# Patient Record
Sex: Male | Born: 1990 | Race: Black or African American | Hispanic: No | Marital: Single | Smoking: Never smoker
Health system: Southern US, Community
[De-identification: ages and names within clinical notes are randomized; demographics above are authoritative.]

## PROBLEM LIST (undated history)

## (undated) HISTORY — PX: KNEE SURGERY: SHX244

## (undated) HISTORY — PX: MOUTH SURGERY: SHX715

---
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
## 2011-03-13 NOTE — Op Note (Signed)
NAME:  Juan Beasley, Juan Beasley NO.:  [id number]   MEDICAL RECORD NO.:  [id number]          PATIENT TYPE:  AMB   LOCATION:  DAY                           FACILITY:  [facility]   PHYSICIAN:  Vickki Hearing, M.D.DATE OF BIRTH:  Aug 17, 1991   DATE OF PROCEDURE:  09/14/2008  DATE OF DISCHARGE:                               OPERATIVE REPORT   PREOPERATIVE DIAGNOSIS:  Infection, left knee.   POSTOPERATIVE DIAGNOSIS:  Infected prepatellar bursa.   PROCEDURE:  Incision and drainage and removal of left prepatellar bursa.   SURGEON:  Vickki Hearing, MD.   ANESTHETIC:  General.   OPERATIVE FINDINGS:  There was a bursal sac, which was infected,  inflamed, and draining purulent material.  This did not violate the  fascial planes.   HISTORY:  A 20 year old male presented with a 4-day history of drainage  from his left knee and a left knee injury.  He had cut the front of his  knee.  He did not tell anybody about it.  It festered for a couple of  days then started to drain.  We saw him in the office and felt that it  was deeper than the superficial infection and recommended surgery.   The patient was identified in the preop holding area.  His left knee was  marked for surgery and countersigned.  No antibiotics were done to  crease the yield of cultures.  The patient was taken to surgery, had  general anesthetic, and left leg was prepped with Betadine and draped  sterilely.  Time-out was completed.   Straight midline incision was made down to the fascia.  There was a  notable sinus tract in a bursal sac, which was infected.  It was  removed.  Fascial planes were inspected and found to be intact.  The  wound was irrigated with pulse lavage and then closed over Iodoform  gauze packing through a separate incision.  Staples were applied to the  skin after 0 Monocryl closure.  The wound was covered sterilely.  The  patient was extubated and taken to recovery room in stable  condition.   Started on clindamycin.  Dressing change will be done on Thursday in the  office.      Vickki Hearing, M.D.  Electronically Signed    SEH/MEDQ  D:  09/15/2008  T:  09/15/2008  Job:  [id number]

## 2011-05-20 ENCOUNTER — Emergency Department (HOSPITAL_COMMUNITY)
Admission: EM | Admit: 2011-05-20 | Discharge: 2011-05-20 | Disposition: A | Discharge status: Home / Self Care | Payer: Self-pay | Attending: Emergency Medicine | Admitting: Emergency Medicine

## 2011-05-20 ENCOUNTER — Encounter: Payer: Self-pay | Admitting: *Deleted

## 2011-05-20 DIAGNOSIS — T63461A Toxic effect of venom of wasps, accidental (unintentional), initial encounter: Secondary | ICD-10-CM | POA: Insufficient documentation

## 2011-05-20 DIAGNOSIS — R061 Stridor: Secondary | ICD-10-CM | POA: Insufficient documentation

## 2011-05-20 DIAGNOSIS — T63441A Toxic effect of venom of bees, accidental (unintentional), initial encounter: Secondary | ICD-10-CM

## 2011-05-20 DIAGNOSIS — T6391XA Toxic effect of contact with unspecified venomous animal, accidental (unintentional), initial encounter: Secondary | ICD-10-CM | POA: Insufficient documentation

## 2011-05-20 DIAGNOSIS — Y92009 Unspecified place in unspecified non-institutional (private) residence as the place of occurrence of the external cause: Secondary | ICD-10-CM | POA: Insufficient documentation

## 2011-05-20 MED ORDER — PREDNISONE 10 MG PO TABS: ORAL_TABLET | ORAL | Status: DC

## 2011-05-20 MED ORDER — PREDNISONE 20 MG PO TABS
60.0000 mg | ORAL_TABLET | Freq: Once | ORAL | Status: AC
  Administered 2011-05-20: 60 mg via ORAL
  Filled 2011-05-20: qty 3

## 2011-05-20 MED ORDER — DIPHENHYDRAMINE HCL 25 MG PO CAPS
50.0000 mg | ORAL_CAPSULE | Freq: Once | ORAL | Status: AC
  Administered 2011-05-20: 50 mg via ORAL
  Filled 2011-05-20: qty 2

## 2011-05-20 MED ORDER — FAMOTIDINE 20 MG PO TABS
20.0000 mg | ORAL_TABLET | Freq: Once | ORAL | Status: AC
  Administered 2011-05-20: 20 mg via ORAL
  Filled 2011-05-20: qty 1

## 2011-05-20 NOTE — ED Notes (Signed)
Pt a/ox4. Resp even and unlabored. D/C instructions reviewed with pt. Pt verbalized understanding. Pt escorted to d/c desk. Pt ambulated with steady gate.

## 2011-05-20 NOTE — ED Notes (Signed)
Pt states was mowing grass yesterday and got stung by multiple bees around head and bil arms.  Swelling noted to face, head.  C/o itching and pain to head.  Denies difficulty breathing/trouble swallowing.

## 2011-05-20 NOTE — ED Provider Notes (Signed)
History     Chief Complaint  Patient presents with  . Allergic Reaction   Patient is a 20 y.o. male presenting with allergic reaction. The history is provided by the patient and a parent. No language interpreter was used.  Allergic Reaction The primary symptoms do not include wheezing, shortness of breath, cough, nausea, vomiting, palpitations, angioedema or urticaria. The current episode started yesterday. The problem has been gradually improving. This is a new problem.  The onset of the reaction was associated with insect bite/sting.    History reviewed. No pertinent past medical history.  Past Surgical History  Procedure Date  . Knee surgery   . Mouth surgery     No family history on file.  History  Substance Use Topics  . Smoking status: Never Smoker   . Smokeless tobacco: Not on file  . Alcohol Use: No      Review of Systems  Respiratory: Positive for stridor. Negative for cough, chest tightness, shortness of breath and wheezing.   Cardiovascular: Negative for palpitations.  Gastrointestinal: Negative for nausea and vomiting.  Skin: Negative for color change and pallor.    Physical Exam  BP 136/84  Pulse 72  Temp(Src) 98.6 F (37 C) (Oral)  Resp 18  Ht 5\' 10"  (1.778 m)  Wt 197 lb 11.2 oz (89.676 kg)  BMI 28.37 kg/m2  SpO2 98%  Physical Exam  Nursing note and vitals reviewed. Constitutional: He is oriented to person, place, and time. Vital signs are normal. He appears well-developed and well-nourished.  HENT:  Head: Normocephalic and atraumatic.  Right Ear: External ear normal.  Left Ear: External ear normal.  Nose: Nose normal.  Mouth/Throat: No oropharyngeal exudate.       Several small areas of swelling to scalp.  Eyes: Conjunctivae and EOM are normal. Pupils are equal, round, and reactive to light. Right eye exhibits no discharge. Left eye exhibits no discharge. No scleral icterus.  Neck: Normal range of motion. Neck supple. No JVD present. No  tracheal deviation present. No thyromegaly present.  Cardiovascular: Normal rate, regular rhythm, normal heart sounds, intact distal pulses and normal pulses.  Exam reveals no gallop and no friction rub.   No murmur heard. Pulmonary/Chest: Effort normal and breath sounds normal. No stridor. No respiratory distress. He has no wheezes. He has no rales. He exhibits no tenderness.  Abdominal: Soft. Normal appearance and bowel sounds are normal. He exhibits no distension and no mass. There is no tenderness. There is no rebound and no guarding.  Musculoskeletal: Normal range of motion. He exhibits no edema and no tenderness.  Lymphadenopathy:    He has no cervical adenopathy.  Neurological: He is alert and oriented to person, place, and time. He has normal reflexes. No cranial nerve deficit. Coordination normal. GCS eye subscore is 4. GCS verbal subscore is 5. GCS motor subscore is 6.  Reflex Scores:      Tricep reflexes are 2+ on the right side and 2+ on the left side.      Bicep reflexes are 2+ on the right side and 2+ on the left side.      Brachioradialis reflexes are 2+ on the right side and 2+ on the left side.      Patellar reflexes are 2+ on the right side and 2+ on the left side.      Achilles reflexes are 2+ on the right side and 2+ on the left side. Skin: Skin is warm and dry. No rash noted. He is  not diaphoretic.       Scattered areas of redness to B forearms.  Psychiatric: He has a normal mood and affect. His speech is normal and behavior is normal. Judgment and thought content normal. Cognition and memory are normal.    ED Course  Procedures  MDM pt in no distress      Worthy Rancher, [place] 05/20/11 1818

## 2011-05-21 NOTE — ED Provider Notes (Signed)
Evaluation and management procedures were performed by the mid-level provider (PA/NP/CNM) under my supervision/collaboration.   Vida Roller, MD 05/21/11 [phone]

## 2011-07-31 LAB — HEMOGLOBIN AND HEMATOCRIT, BLOOD
HCT: 37.3
Hemoglobin: 12.5

## 2011-07-31 LAB — WOUND CULTURE

## 2011-07-31 LAB — ANAEROBIC CULTURE

## 2011-09-10 ENCOUNTER — Emergency Department (HOSPITAL_COMMUNITY)
Admission: EM | Admit: 2011-09-10 | Discharge: 2011-09-10 | Disposition: A | Discharge status: Home / Self Care | Payer: Self-pay | Attending: Emergency Medicine | Admitting: Emergency Medicine

## 2011-09-10 ENCOUNTER — Encounter (HOSPITAL_COMMUNITY): Payer: Self-pay | Admitting: Emergency Medicine

## 2011-09-10 DIAGNOSIS — IMO0002 Reserved for concepts with insufficient information to code with codable children: Secondary | ICD-10-CM | POA: Insufficient documentation

## 2011-09-10 DIAGNOSIS — IMO0001 Reserved for inherently not codable concepts without codable children: Secondary | ICD-10-CM | POA: Insufficient documentation

## 2011-09-10 MED ORDER — BUPIVACAINE HCL (PF) 0.5 % IJ SOLN
30.0000 mL | Freq: Once | INTRAMUSCULAR | Status: AC
  Administered 2011-09-10: 30 mL
  Filled 2011-09-10: qty 30

## 2011-09-10 MED ORDER — BACITRACIN-NEOMYCIN-POLYMYXIN 400-5-5000 EX OINT
TOPICAL_OINTMENT | Freq: Once | CUTANEOUS | Status: AC
  Administered 2011-09-10: 1 via TOPICAL
  Filled 2011-09-10: qty 1

## 2011-09-10 MED ORDER — CEPHALEXIN 500 MG PO CAPS: 500.0000 mg | ORAL_CAPSULE | Freq: Three times a day (TID) | ORAL | Status: AC

## 2011-09-10 NOTE — ED Notes (Signed)
Pt states he bit his left thumb nail to the quick and now around the nail is swollen and painful.

## 2011-09-10 NOTE — ED Notes (Signed)
Pt a/ox4. Resp even and unlabored. NAD at this time. D/C instructions and Rx x1 reviewed with pt. Pt verbalized understanding. Pt ambulated to lobby with steady gate.

## 2011-09-10 NOTE — ED Provider Notes (Signed)
History     CSN: [id number] Arrival date & time: 09/10/2011 11:04 AM   First MD Initiated Contact with Patient 09/10/11 1155      Chief Complaint  Patient presents with  . Hand Pain    (Consider location/radiation/quality/duration/timing/severity/associated sxs/prior treatment) HPI Comments: Patient c/o pain, redness and swelling to his left medial thumb.  Has hx of same.  States that he bites his fingernails frequently.  He denies fever, red streaks or numbness of his hand or finger.  Patient is a 20 y.o. male presenting with hand pain. The history is provided by the patient.  Hand Pain This is a recurrent problem. The current episode started in the past 7 days. The problem occurs constantly. The problem has been gradually worsening. Associated symptoms include myalgias. Pertinent negatives include no arthralgias, chills, fatigue, fever, numbness, rash, swollen glands, vomiting or weakness. The symptoms are aggravated by exertion (palpation and movement). He has tried nothing for the symptoms.    History reviewed. No pertinent past medical history.  Past Surgical History  Procedure Date  . Knee surgery   . Mouth surgery     History reviewed. No pertinent family history.  History  Substance Use Topics  . Smoking status: Never Smoker   . Smokeless tobacco: Not on file  . Alcohol Use: No      Review of Systems  Constitutional: Negative for fever, chills and fatigue.  HENT: Negative for trouble swallowing.   Gastrointestinal: Negative for vomiting.  Genitourinary: Negative for dysuria, hematuria and flank pain.  Musculoskeletal: Positive for myalgias. Negative for back pain and arthralgias.  Skin: Positive for color change and wound. Negative for rash.  Neurological: Negative for dizziness, weakness and numbness.  Hematological: Negative for adenopathy. Does not bruise/bleed easily.  All other systems reviewed and are negative.    Allergies  Review of patient's  allergies indicates no known allergies.  Home Medications  No current outpatient prescriptions on file.  BP 116/73  Pulse 72  Temp(Src) 98.5 F (36.9 C) (Oral)  Resp 18  Ht 5\' 10"  (1.778 m)  Wt 203 lb (92.08 kg)  BMI 29.13 kg/m2  SpO2 99%  Physical Exam  Nursing note and vitals reviewed. Constitutional: He is oriented to person, place, and time. He appears well-developed and well-nourished. No distress.  HENT:  Head: Normocephalic and atraumatic.  Mouth/Throat: Oropharynx is clear and moist.  Cardiovascular: Normal rate, regular rhythm and normal heart sounds.   Pulmonary/Chest: Breath sounds normal.  Musculoskeletal: Normal range of motion. He exhibits edema and tenderness.       Hands: Neurological: He is alert and oriented to person, place, and time. No cranial nerve deficit. He exhibits normal muscle tone. Coordination normal.  Skin: Skin is warm and dry.       See MS exam    ED Course  Drain paronychia Date/Time: 09/10/2011 1:51 PM Performed by: Trisha Mangle, Siomara Burkel L. Authorized by: Maxwell Caul Consent: Verbal consent obtained. Written consent not obtained. Risks and benefits: risks, benefits and alternatives were discussed Consent given by: patient Patient understanding: patient states understanding of the procedure being performed Patient consent: the patient's understanding of the procedure matches consent given Patient identity confirmed: verbally with patient Time out: Immediately prior to procedure a "time out" was called to verify the correct patient, procedure, equipment, support staff and site/side marked as required. Preparation: Patient was prepped and draped in the usual sterile fashion. Local anesthesia used: digital block performed using .5% marcaine to medial thumb  Patient  sedated: no Patient tolerance: Patient tolerated the procedure well with no immediate complications.   (including critical care time)       MDM  09/10/2011 Paronchyia  to the left medial thumb.  Successfully drained by me.  Pt feeling better.  No felon           Aiesha Leland L. [place], [place] 09/10/11 2126

## 2011-09-10 NOTE — ED Notes (Signed)
Telfa applied with tube gauze dressing to left thumb.

## 2011-09-11 NOTE — ED Provider Notes (Signed)
Medical screening examination/treatment/procedure(s) were performed by non-physician practitioner and as supervising physician I was immediately available for consultation/collaboration.  Nicoletta Dress. Colon Branch, MD 09/11/11 [phone]

## 2013-12-15 ENCOUNTER — Emergency Department (HOSPITAL_COMMUNITY): Payer: Self-pay

## 2013-12-15 ENCOUNTER — Encounter (HOSPITAL_COMMUNITY): Payer: Self-pay | Admitting: Emergency Medicine

## 2013-12-15 ENCOUNTER — Emergency Department (HOSPITAL_COMMUNITY)
Admission: EM | Admit: 2013-12-15 | Discharge: 2013-12-15 | Disposition: A | Discharge status: Home / Self Care | Payer: Self-pay | Attending: Emergency Medicine | Admitting: Emergency Medicine

## 2013-12-15 DIAGNOSIS — J039 Acute tonsillitis, unspecified: Secondary | ICD-10-CM | POA: Insufficient documentation

## 2013-12-15 MED ORDER — CLINDAMYCIN PHOSPHATE 900 MG/50ML IV SOLN
900.0000 mg | Freq: Once | INTRAVENOUS | Status: AC
  Administered 2013-12-15: 900 mg via INTRAVENOUS
  Filled 2013-12-15: qty 50

## 2013-12-15 MED ORDER — IBUPROFEN 600 MG PO TABS: 600.0000 mg | ORAL_TABLET | Freq: Four times a day (QID) | ORAL | Status: DC | PRN

## 2013-12-15 MED ORDER — CLINDAMYCIN HCL 300 MG PO CAPS: 300.0000 mg | ORAL_CAPSULE | Freq: Four times a day (QID) | ORAL | Status: DC

## 2013-12-15 MED ORDER — IOHEXOL 300 MG/ML  SOLN
75.0000 mL | Freq: Once | INTRAMUSCULAR | Status: AC | PRN
  Administered 2013-12-15: 75 mL via INTRAVENOUS

## 2013-12-15 MED ORDER — DEXAMETHASONE SODIUM PHOSPHATE 10 MG/ML IJ SOLN
10.0000 mg | Freq: Once | INTRAMUSCULAR | Status: AC
  Administered 2013-12-15: 10 mg via INTRAVENOUS
  Filled 2013-12-15: qty 1

## 2013-12-15 MED ORDER — IBUPROFEN 100 MG/5ML PO SUSP
400.0000 mg | Freq: Once | ORAL | Status: AC
  Administered 2013-12-15: 400 mg via ORAL
  Filled 2013-12-15: qty 20

## 2013-12-15 NOTE — ED Provider Notes (Signed)
CSN: [id number]     Arrival date & time 12/15/13  1244 History   First MD Initiated Contact with Patient 12/15/13 1257     Chief Complaint  Patient presents with  . Sore Throat     (Consider location/radiation/quality/duration/timing/severity/associated sxs/prior Treatment) HPI Comments: Juan Beasley is a 23 y.o. Male presenting with a two-day history of sore throat, globus sensation and subjective fevers and nonproductive cough.  Has also had nasal congestion with clear nasal discharge.  He has noticed changes in his voice since his symptoms began.  He is able to drink but has had reduced solid food intake secondary to pain.  He has had no medications prior to arrival for this problem.  He denies shortness of breath, wheezing, stridor, chest pain, nausea or vomiting.  He has noticed several swollen knots in his neck.     The history is provided by the patient.    History reviewed. No pertinent past medical history. Past Surgical History  Procedure Laterality Date  . Knee surgery    . Mouth surgery     History reviewed. No pertinent family history. History  Substance Use Topics  . Smoking status: Never Smoker   . Smokeless tobacco: Not on file  . Alcohol Use: No    Review of Systems  Constitutional: Positive for fever and chills.  HENT: Positive for congestion, rhinorrhea and sore throat. Negative for ear pain, sinus pressure, trouble swallowing and voice change.   Eyes: Negative for discharge.  Respiratory: Positive for cough. Negative for shortness of breath, wheezing and stridor.   Cardiovascular: Negative for chest pain.  Gastrointestinal: Negative for abdominal pain.  Genitourinary: Negative.   Skin: Negative for color change and rash.      Allergies  Review of patient's allergies indicates no known allergies.  Home Medications   Current Outpatient Rx  Name  Route  Sig  Dispense  Refill  . clindamycin (CLEOCIN) 300 MG capsule   Oral   Take 1 capsule (300  mg total) by mouth 4 (four) times daily.   28 capsule   0   . ibuprofen (ADVIL,MOTRIN) 600 MG tablet   Oral   Take 1 tablet (600 mg total) by mouth every 6 (six) hours as needed for moderate pain.   30 tablet   0    BP 141/71  Pulse 106  Temp(Src) 99.4 F (37.4 C) (Oral)  Ht 5\' 9"  (1.753 m)  Wt 220 lb (99.791 kg)  BMI 32.47 kg/m2  SpO2 97% Physical Exam  Constitutional: He is oriented to person, place, and time. He appears well-developed and well-nourished.  HENT:  Head: Normocephalic and atraumatic.  Right Ear: Tympanic membrane, external ear and ear canal normal.  Left Ear: Tympanic membrane, external ear and ear canal normal.  Nose: Mucosal edema and rhinorrhea present.  Mouth/Throat: Uvula is midline and mucous membranes are normal. No oropharyngeal exudate, posterior oropharyngeal edema, posterior oropharyngeal erythema or tonsillar abscesses.  Uvula is midline.  Posterior pharynx and tonsillar pillars are.  Benefits, there is bilateral tonsillar edema with right greater than left, no obvious peritonsillar abscess.  No exudate.  Tonsillar lymph nodes are enlarged and tender.  Eyes: Conjunctivae are normal.  Cardiovascular: Normal rate and normal heart sounds.   Pulmonary/Chest: Effort normal. No respiratory distress. He has no wheezes. He has no rales.  Abdominal: Soft. There is no tenderness.  Musculoskeletal: Normal range of motion.  Neurological: He is alert and oriented to person, place, and time.  Skin: Skin  is warm and dry. No rash noted.  Psychiatric: He has a normal mood and affect.    ED Course  Procedures (including critical care time) Labs Review Labs Reviewed - No data to display Imaging Review Ct Soft Tissue Neck W Contrast  12/15/2013   CLINICAL DATA:  SORE THROAT SORE THROAT  EXAM: CT NECK WITH CONTRAST  TECHNIQUE: Multidetector CT imaging of the neck was performed using the standard protocol following the bolus administration of intravenous contrast.   CONTRAST:  75mL OMNIPAQUE IOHEXOL 300 MG/ML  SOLN  COMPARISON:  None.  FINDINGS: The skull base is unremarkable.  There is diffuse increased soft tissue prominence laterally and posteriorly extending from the level of the hard palate through the lower portion of the pharynx. These findings are greater on the right. The soft tissue prominence extends into the midline and appears to be adjacent to and possibly abutting the uvula. Similar findings are identified on the left at the level of the lead uvula though less prominent. The airway is patent. No appreciable loculated fluid collection is appreciated. There is partial obliteration of the parapharyngeal space. The retropharyngeal space is obliterated as well as the prevertebral space. The glottic and laryngeal regions are unremarkable. Adenopathy is appreciated within the submental region with the largest lymph node projecting on the left image 58 series 2 measuring 13 at mm in short axis. Smaller still prominent appearing lymph nodes project within the carotid space as well as within the remaining components of the anterior and posterior cervical chains.  The submandibular glands and parotid glands are symmetric and unremarkable. The opacified vascular structures are unremarkable. There is no further evidence of neck masses, free fluid, or loculated fluid collections.  The lung apices demonstrate no gross abnormalities.  IMPRESSION: Diffuse swelling in the parapharyngeal and retro pharyngeal regions regions right greater than left. The airway is patent. These findings are consistent with pharyngitis as well as tonsillitis. An early abscess or phlegmon cannot be excluded though not clearly radiographically appreciate. Surgical consultation recommended if clinically warranted. There is associated reactive adenopathy. Though of much lower differential considerations more ominous etiologies cannot be excluded. These results were called by telephone at the time of  interpretation on 12/15/2013 at 2:24 PM to Dr. Judd Lienelo , who verbally acknowledged these results.   Electronically Signed   By: Salome HolmesHector  Cooper M.D.   On: 12/15/2013 14:28    EKG Interpretation   None       MDM   Final diagnoses:  Acute tonsillitis    Patient was discussed with Dr. Judd Lienelo.  CT scan was obtained and results shared with Dr. Suszanne Connerseoh.  He recommended clindamycin 900 mg IV, Decadron 20 mg IV and he will see this patient for followup in 2 days in his office.  Patient is agreeable with plan.  He'll continue with oral clindamycin 300 mg 4 times a day.  He was advised also to return here for recheck at any point he feels his symptoms are worsening.    Burgess AmorJulie Kevork Joyce, PA-C 12/15/13 1708

## 2013-12-15 NOTE — ED Notes (Signed)
Sore throat, Throat is red, swollen. Alert, NAD, cough

## 2013-12-15 NOTE — ED Notes (Signed)
Sore throat for 2 days

## 2013-12-15 NOTE — Discharge Instructions (Signed)
Tonsillitis Tonsillitis is an infection of the throat that causes the tonsils to become red, tender, and swollen. Tonsils are collections of lymphoid tissue at the back of the throat. Each tonsil has crevices (crypts). Tonsils help fight nose and throat infections and keep infection from spreading to other parts of the body for the first 18 months of life.  CAUSES Sudden (acute) tonsillitis is usually caused by infection with streptococcal bacteria. Long-lasting (chronic) tonsillitis occurs when the crypts of the tonsils become filled with pieces of food and bacteria, which makes it easy for the tonsils to become repeatedly infected. SYMPTOMS  Symptoms of tonsillitis include:  A sore throat, with possible difficulty swallowing.  White patches on the tonsils.  Fever.  Tiredness.  New episodes of snoring during sleep, when you did not snore before.  Small, foul-smelling, yellowish-white pieces of material (tonsilloliths) that you occasionally cough up or spit out. The tonsilloliths can also cause you to have bad breath. DIAGNOSIS Tonsillitis can be diagnosed through a physical exam. Diagnosis can be confirmed with the results of lab tests, including a throat culture. TREATMENT  The goals of tonsillitis treatment include the reduction of the severity and duration of symptoms and prevention of associated conditions. Symptoms of tonsillitis can be improved with the use of steroids to reduce the swelling. Tonsillitis caused by bacteria can be treated with antibiotics. Usually, treatment with antibiotics is started before the cause of the tonsillitis is known. However, if it is determined that the cause is not bacterial, antibiotics will not treat the tonsillitis. If attacks of tonsillitis are severe and frequent, your caregiver may recommend surgery to remove the tonsils (tonsillectomy). HOME CARE INSTRUCTIONS   Rest as much as possible and get plenty of sleep.  Drink plenty of fluids. While the  throat is very sore, eat soft foods or liquids, such as sherbet, soups, or instant breakfast drinks.  Eat frozen ice pops.  Gargle with a warm or cold liquid to help soothe the throat. Mix 1/4 teaspoon of salt and 1/4 teaspoon of baking soda in in 8 oz of water. SEEK MEDICAL CARE IF:   Large, tender lumps develop in your neck.  A rash develops.  A green, yellow-brown, or bloody substance is coughed up.  You are unable to swallow liquids or food for 24 hours.  You notice that only one of the tonsils is swollen. SEEK IMMEDIATE MEDICAL CARE IF:   You develop any new symptoms such as vomiting, severe headache, stiff neck, chest pain, or trouble breathing or swallowing.  You have severe throat pain along with drooling or voice changes.  You have severe pain, unrelieved with recommended medications.  You are unable to fully open the mouth.  You develop redness, swelling, or severe pain anywhere in the neck.  You have a fever. MAKE SURE YOU:   Understand these instructions.  Will watch your condition.  Will get help right away if you are not doing well or get worse. Document Released: 07/25/2005 Document Revised: 06/17/2013 Document Reviewed: 04/03/2013 [facility] Patient Information 2014 [place], [place].  Take your next dose of the antibiotic  tomorrow morning.

## 2013-12-17 ENCOUNTER — Ambulatory Visit (INDEPENDENT_AMBULATORY_CARE_PROVIDER_SITE_OTHER): Payer: Self-pay | Admitting: Otolaryngology

## 2013-12-17 NOTE — ED Provider Notes (Signed)
Medical screening examination/treatment/procedure(s) were performed by non-physician practitioner and as supervising physician I was immediately available for consultation/collaboration.     Geoffery Lyonsouglas Chastidy Ranker, MD 12/17/13 [phone]

## 2014-08-09 ENCOUNTER — Emergency Department (HOSPITAL_COMMUNITY): Payer: Self-pay

## 2014-08-09 ENCOUNTER — Encounter (HOSPITAL_COMMUNITY): Payer: Self-pay | Admitting: Emergency Medicine

## 2014-08-09 ENCOUNTER — Emergency Department (HOSPITAL_COMMUNITY)
Admission: EM | Admit: 2014-08-09 | Discharge: 2014-08-09 | Disposition: A | Discharge status: Home / Self Care | Payer: Self-pay | Attending: Emergency Medicine | Admitting: Emergency Medicine

## 2014-08-09 DIAGNOSIS — B353 Tinea pedis: Secondary | ICD-10-CM | POA: Insufficient documentation

## 2014-08-09 MED ORDER — NAPROXEN 500 MG PO TABS: 500.0000 mg | ORAL_TABLET | Freq: Two times a day (BID) | ORAL | Status: DC

## 2014-08-09 MED ORDER — CLOTRIMAZOLE 1 % EX CREA: TOPICAL_CREAM | CUTANEOUS | Status: DC

## 2014-08-09 NOTE — ED Notes (Signed)
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   Toes are sore, tender since wearing new boots

## 2014-08-09 NOTE — ED Notes (Signed)
Patient complaining of pain in right 4th and 5th toe. 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 Denies injury.

## 2014-08-09 NOTE — ED Provider Notes (Signed)
CSN: [id number]     Arrival date & time 08/09/14  1513 History  This chart was scribed for non-physician practitioner Pauline Ausammy Bo Teicher, PA-C working with Donnetta HutchingBrian Cook, MD by Littie Deedsichard Sun, ED Scribe. This patient was seen in room APFT22/APFT22 and the patient's care was started at 4:29 PM.      Chief Complaint  Patient presents with  . Toe Pain     The history is provided by the patient. No language interpreter was used.   HPI Comments: Juan Beasley is a 23 y.o. male who presents to the Emergency Department complaining of gradual onset, constant toe pain in his right 4th and 5th toes since 3 days ago. 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 Patient states feeling some occasional itching on his toes and redness to the bottom of the toes. He noticed these symptoms after wearing a new pair of boots. He denies feeling tightness when wearing the boots, but states that his feet get sweaty when wearing boots. He denies any injury, swelling, numbness or pain proximal to his toes.  He has not tried any therapies prior to ED arrival  History reviewed. No pertinent past medical history. Past Surgical History  Procedure Laterality Date  . Knee surgery    . Mouth surgery     History reviewed. No pertinent family history. History  Substance Use Topics  . Smoking status: Never Smoker   . Smokeless tobacco: Not on file  . Alcohol Use: No    Review of Systems  Constitutional: Negative for appetite change and fatigue.  Musculoskeletal: Positive for arthralgias. Negative for back pain.  Skin: Negative for rash.  Neurological: Negative for seizures, weakness, numbness and headaches.  Hematological: Negative for adenopathy.  All other systems reviewed and are negative.     Allergies  Review of patient's allergies indicates no known allergies.  Home Medications   Prior to Admission medications   Not on File   BP 135/72  Pulse 75   Temp(Src) 98.4 F (36.9 C) (Oral)  SpO2 99% Physical Exam  Nursing note and vitals reviewed. Constitutional: He is oriented to person, place, and time. He appears well-developed and well-nourished. No distress.  HENT:  Head: Normocephalic and atraumatic.  Mouth/Throat: No oropharyngeal exudate.  Cardiovascular: Normal rate, regular rhythm, normal heart sounds and intact distal pulses.   No murmur heard. Pulmonary/Chest: Effort normal and breath sounds normal. No respiratory distress.  Musculoskeletal: Normal range of motion. He exhibits no edema.  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  No edema, drainage or tenderness proximal to the toes.  Neurological: He is alert and oriented to person, place, and time. No cranial nerve deficit.  DP pulses brisk, distal sensation intact  Skin: Skin is warm and dry. No rash noted.  Psychiatric: He has a normal mood and affect. His behavior is normal.    ED Course  Procedures  DIAGNOSTIC STUDIES: Oxygen Saturation is 99% on RA, nml by my interpretation.    COORDINATION OF CARE: 4:34 PM-Discussed treatment plan which includes imaging with pt at bedside and pt agreed to plan.   Labs Review Labs Reviewed - No data to display  Imaging Review Dg Foot Complete Right  08/09/2014   CLINICAL DATA:  Right foot pain. Fourth and fifth metatarsal pain. No prior surgery or trauma reported.  EXAM: RIGHT FOOT COMPLETE - 3+  VIEW  COMPARISON:  None.  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 No evidence of acute fracture dislocation in the midfoot or forefoot. The phalanges are normal. Calcaneus is normal.  IMPRESSION: 1. No acute findings of the right foot. 2. 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  This may be source of patient's pain. Recommend nonemergent orthopedic consultation.   Electronically Signed   By: Genevive BiStewart  Edmunds M.D.   On: 08/09/2014 17:05    EKG Interpretation None      MDM   Final diagnoses:  Tinea pedis of right foot    Pt is well appearing, non-toxic.  No concerning sx's for cellulitis.  Will treat with clotrimazole and naprosyn.  Pt advised to f/u with orthopedics regarding XR findings were are felt to be an incidental finding and likely congenital given that pt does not have pain to this area     I personally performed the services described in this documentation, which was scribed in my presence. The recorded information has been reviewed and is accurate.    Nolie Bignell L. Murrel Bertram, PA-C 08/12/14 1318

## 2014-08-09 NOTE — Discharge Instructions (Signed)
Athlete's Foot  Athlete's foot is a skin infection caused by a fungus. Athlete's foot is often seen between or under the toes. It can also be seen on the bottom of the foot. Athlete's foot can spread to other people by sharing towels or shower stalls. HOME CARE  Only take medicines as told by your doctor. Do not use steroid creams.  Wash your feet daily. Dry your feet well, especially between the toes.  Change your socks every day. Wear cotton or wool socks.  Change your socks 2 to 3 times a day in hot weather.  Wear sandals or canvas tennis shoes with good airflow.  If you have blisters, soak your feet in a solution as told by your doctor. Do this for 20 to 30 minutes, 2 times a day. Dry your feet well after you soak them.  Do not share towels.  Wear sandals when you use shared locker rooms or showers. GET HELP RIGHT AWAY IF:   You have a fever.  Your foot is puffy (swollen), sore, warm, or red.  You are not getting better after 7 days of treatment.  You still have athlete's foot after 30 days.  You have problems caused by your medicine. MAKE SURE YOU:   Understand these instructions.  Will watch your condition.  Will get help right away if you are not doing well or get worse. Document Released: 04/02/2008 Document Revised: 01/07/2012 Document Reviewed: 08/03/2011 [employer] Patient Information 2015 [employer]. This information is not intended to replace advice given to you by your health care provider. Make sure you discuss any questions you have with your health care provider.  

## 2014-08-13 NOTE — ED Provider Notes (Signed)
Medical screening examination/treatment/procedure(s) were performed by non-physician practitioner and as supervising physician I was immediately available for consultation/collaboration.   EKG Interpretation None       Ortha Metts, MD 08/13/14 1409 

## 2014-09-30 ENCOUNTER — Emergency Department (HOSPITAL_COMMUNITY): Payer: Self-pay

## 2014-09-30 ENCOUNTER — Emergency Department (HOSPITAL_COMMUNITY)
Admission: EM | Admit: 2014-09-30 | Discharge: 2014-09-30 | Disposition: A | Discharge status: Home / Self Care | Payer: Self-pay | Attending: Emergency Medicine | Admitting: Emergency Medicine

## 2014-09-30 ENCOUNTER — Encounter (HOSPITAL_COMMUNITY): Payer: Self-pay | Admitting: Emergency Medicine

## 2014-09-30 DIAGNOSIS — R079 Chest pain, unspecified: Secondary | ICD-10-CM | POA: Insufficient documentation

## 2014-09-30 DIAGNOSIS — R05 Cough: Secondary | ICD-10-CM

## 2014-09-30 DIAGNOSIS — Z791 Long term (current) use of non-steroidal anti-inflammatories (NSAID): Secondary | ICD-10-CM | POA: Insufficient documentation

## 2014-09-30 DIAGNOSIS — R059 Cough, unspecified: Secondary | ICD-10-CM

## 2014-09-30 DIAGNOSIS — J040 Acute laryngitis: Secondary | ICD-10-CM | POA: Insufficient documentation

## 2014-09-30 DIAGNOSIS — Z79899 Other long term (current) drug therapy: Secondary | ICD-10-CM | POA: Insufficient documentation

## 2014-09-30 MED ORDER — BENZONATATE 100 MG PO CAPS: 100.0000 mg | ORAL_CAPSULE | Freq: Three times a day (TID) | ORAL | Status: DC

## 2014-09-30 NOTE — Discharge Instructions (Signed)
Please call your doctor for a followup appointment within 24-48 hours. When you talk to your doctor please let them know that you were seen in the emergency department and have them acquire all of your records so that they can discuss the findings with you and formulate a treatment plan to fully care for your new and ongoing problems. ° °

## 2014-09-30 NOTE — ED Notes (Signed)
Pt c/o cough with congestion. Pt states he feels sob and vomited once last night.

## 2014-09-30 NOTE — ED Provider Notes (Signed)
CSN: [id number]     Arrival date & time 09/30/14  1929 History   First MD Initiated Contact with Patient 09/30/14 1939     Chief Complaint  Patient presents with  . Cough     (Consider location/radiation/quality/duration/timing/severity/associated sxs/prior Treatment) HPI Comments: The patient is a 23 year old male, he has no known past medical history, he reports approximately 2 days of a progressive cough with nasal congestion and drainage and states that yesterday his voice without any developed a hoarseness to his voice and the pain in his upper chest especially with coughing. There is no fevers chills nausea vomiting diarrhea or dysuria. He denies any swelling rashes numbness weakness or headaches. He denies any sick contacts. His chest pain gets worse with coughing, slightly worse with taking a deep breath. He has taken no medications including over-the-counter medications other than TheraFlu prior to arrival and states that this had minimal effect on his symptoms.  Patient is a 23 y.o. male presenting with cough. The history is provided by the patient.  Cough   History reviewed. No pertinent past medical history. Past Surgical History  Procedure Laterality Date  . Knee surgery    . Mouth surgery     No family history on file. History  Substance Use Topics  . Smoking status: Never Smoker   . Smokeless tobacco: Not on file  . Alcohol Use: No    Review of Systems  Respiratory: Positive for cough.   All other systems reviewed and are negative.     Allergies  Review of patient's allergies indicates no known allergies.  Home Medications   Prior to Admission medications   Medication Sig Start Date End Date Taking? Authorizing Provider  benzonatate (TESSALON) 100 MG capsule Take 1 capsule (100 mg total) by mouth every 8 (eight) hours. 09/30/14   Vida RollerBrian D Bettie Swavely, MD  clotrimazole (LOTRIMIN) 1 % cream Apply to affected area 2 times daily 08/09/14   Tammy L. Triplett, PA-C   naproxen (NAPROSYN) 500 MG tablet Take 1 tablet (500 mg total) by mouth 2 (two) times daily. Take with food 08/09/14   Tammy L. Triplett, PA-C   BP 108/64 mmHg  Pulse 80  Temp(Src) 97.9 F (36.6 C)  Resp 18  Ht 5\' 10"  (1.778 m)  Wt 220 lb (99.791 kg)  BMI 31.57 kg/m2  SpO2 100% Physical Exam  Constitutional: He appears well-developed and well-nourished. No distress.  HENT:  Head: Normocephalic and atraumatic.  Mouth/Throat: Oropharynx is clear and moist. No oropharyngeal exudate.  Bilateral nasal discharge clear rhinorrhea, oropharynx is clear and moist without erythema exudate asymmetry or hypertrophy, mucous membranes are moist, tympanic membranes are clear bilaterally with erythema on the right  Eyes: Conjunctivae and EOM are normal. Pupils are equal, round, and reactive to light. Right eye exhibits no discharge. Left eye exhibits no discharge. No scleral icterus.  Neck: Normal range of motion. Neck supple. No JVD present. No thyromegaly present.  Very supple neck, no lymphadenopathy, no stiffness  Cardiovascular: Normal rate, regular rhythm, normal heart sounds and intact distal pulses.  Exam reveals no gallop and no friction rub.   No murmur heard. Pulmonary/Chest: Effort normal and breath sounds normal. No respiratory distress. He has no wheezes. He has no rales.  Abdominal: Soft. Bowel sounds are normal. He exhibits no distension and no mass. There is no tenderness.  Musculoskeletal: Normal range of motion. He exhibits no edema or tenderness.  Lymphadenopathy:    He has no cervical adenopathy.  Neurological: He is  alert. Coordination normal.  Skin: Skin is warm and dry. No rash noted. No erythema.  Psychiatric: He has a normal mood and affect. His behavior is normal.  Nursing note and vitals reviewed.   ED Course  Procedures (including critical care time) Labs Review Labs Reviewed - No data to display  Imaging Review No results found.    MDM   Final diagnoses:   Cough  Laryngitis    Overall the patient appears well, he has normal vital signs including oxygen, pulse and temperature, suspect that the patient has a laryngitis or upper respiratory infection as I do not hear a hoarseness to his voice nor do I hear a barky dry cough. He appears medically stable, x-ray to rule out pneumonia, otherwise anticipate discharge home.  I have personally seen and interpretted the 2 view PA and Lateral views of the chest - no signs of pneumothorax and no pneumonia or soft tissue abnormlaity, no sub diaphragmatic air, no fractures.  Pt will be given cough suppressant - stable appearing for d/c.  Meds given in ED:  Medications - No data to display  New Prescriptions   BENZONATATE (TESSALON) 100 MG CAPSULE    Take 1 capsule (100 mg total) by mouth every 8 (eight) hours.      Vida RollerBrian D Khali Perella, MD 09/30/14 2032

## 2015-03-25 ENCOUNTER — Emergency Department (HOSPITAL_COMMUNITY)
Admission: EM | Admit: 2015-03-25 | Discharge: 2015-03-25 | Disposition: A | Discharge status: Home / Self Care | Payer: Self-pay | Attending: Emergency Medicine | Admitting: Emergency Medicine

## 2015-03-25 ENCOUNTER — Encounter (HOSPITAL_COMMUNITY): Payer: Self-pay

## 2015-03-25 DIAGNOSIS — B356 Tinea cruris: Secondary | ICD-10-CM | POA: Insufficient documentation

## 2015-03-25 DIAGNOSIS — Z79899 Other long term (current) drug therapy: Secondary | ICD-10-CM | POA: Insufficient documentation

## 2015-03-25 DIAGNOSIS — B353 Tinea pedis: Secondary | ICD-10-CM | POA: Insufficient documentation

## 2015-03-25 MED ORDER — TOLNAFTATE 1 % EX POWD: 1.0000 "application " | Freq: Two times a day (BID) | CUTANEOUS | Status: DC

## 2015-03-25 MED ORDER — TOLNAFTATE 1 % EX CREA: 1.0000 "application " | TOPICAL_CREAM | Freq: Two times a day (BID) | CUTANEOUS | Status: DC

## 2015-03-25 MED ORDER — FLUCONAZOLE 100 MG PO TABS
200.0000 mg | ORAL_TABLET | Freq: Once | ORAL | Status: AC
  Administered 2015-03-25: 200 mg via ORAL
  Filled 2015-03-25: qty 2

## 2015-03-25 NOTE — ED Notes (Signed)
Complain of rash between toes on right foot and groin area

## 2015-03-25 NOTE — Discharge Instructions (Signed)
You have, and jock itch. Please apply the cream 2 times daily, and the powder 2 times daily to the affected areas. Please see your doctor or return to the emergency department if any signs of pus like drainage or infection. Athlete's Foot  Athlete's foot is a skin infection caused by a fungus. Athlete's foot is often seen between or under the toes. It can also be seen on the bottom of the foot. Athlete's foot can spread to other people by sharing towels or shower stalls. HOME CARE  Only take medicines as told by your doctor. Do not use steroid creams.  Wash your feet daily. Dry your feet well, especially between the toes.  Change your socks every day. Wear cotton or wool socks.  Change your socks 2 to 3 times a day in hot weather.  Wear sandals or canvas tennis shoes with good airflow.  If you have blisters, soak your feet in a solution as told by your doctor. Do this for 20 to 30 minutes, 2 times a day. Dry your feet well after you soak them.  Do not share towels.  Wear sandals when you use shared locker rooms or showers. GET HELP RIGHT AWAY IF:   You have a fever.  Your foot is puffy (swollen), sore, warm, or red.  You are not getting better after 7 days of treatment.  You still have athlete's foot after 30 days.  You have problems caused by your medicine. MAKE SURE YOU:   Understand these instructions.  Will watch your condition.  Will get help right away if you are not doing well or get worse. Document Released: 04/02/2008 Document Revised: 01/07/2012 Document Reviewed: 08/03/2011 [facility] Patient Information 2015 [employer] [place]. This information is not intended to replace advice given to you by your health care provider. Make sure you discuss any questions you have with your health care provider.  Jock Itch Jock itch is a fungal infection of the skin in the groin area. It is sometimes called "ringworm" even though it is not caused by a worm. A fungus is a type of germ  that thrives in dark, damp places.  CAUSES  This infection may spread from:  A fungus infection elsewhere on the body (such as athlete's foot).  Sharing towels or clothing. This infection is more common in:  Hot, humid climates.  People who wear tight-fitting clothing or wet bathing suits for long periods of time.  Athletes.  Overweight people.  People with diabetes. SYMPTOMS  Jock itch causes the following symptoms:  Red, pink or brown rash in the groin. Rash may spread to the thighs, anus, and buttocks.  Itching. DIAGNOSIS  Your caregiver may make the diagnosis by looking at the rash. Sometimes a skin scraping will be sent to test for fungus. Testing can be done either by looking under the microscope or by doing a culture (test to try to grow the fungus). A culture can take up to 2 weeks to come back. TREATMENT  Jock itch may be treated with:  Skin cream or ointment to kill fungus.  Medicine by mouth to kill fungus.  Skin cream or ointment to calm the itching.  Compresses or medicated powders to dry the infected skin. HOME CARE INSTRUCTIONS   Be sure to treat the rash completely. Follow your caregiver's instructions. It can take a couple of weeks to treat. If you do not treat the infection long enough, the rash can come back.  Wear loose-fitting clothing.  Men should wear  cotton boxer shorts.  Women should wear cotton underwear.  Avoid hot baths.  Dry the groin area well after bathing. SEEK MEDICAL CARE IF:   Your rash is worse.  Your rash is spreading.  Your rash returns after treatment is finished.  Your rash is not gone in 4 weeks. Fungal infections are slow to respond to treatment. Some redness may remain for several weeks after the fungus is gone. SEEK IMMEDIATE MEDICAL CARE IF:  The area becomes red, warm, tender, and swollen.  You have a fever. Document Released: 10/05/2002 Document Revised: 01/07/2012 Document Reviewed: 09/03/2008 [facility]  Patient Information 2015 [place], [place]. This information is not intended to replace advice given to you by your health care provider. Make sure you discuss any questions you have with your health care provider.

## 2015-03-25 NOTE — ED Notes (Signed)
Pt was not found in room when nurse went to discharge pt.  Pt had not yet received his discharge papers.  Papers will be left at the front desk of ED.

## 2015-03-25 NOTE — ED Provider Notes (Signed)
CSN: [id number]     Arrival date & time 03/25/15  4098 History   First MD Initiated Contact with Patient 03/25/15 [phone]     Chief Complaint  Patient presents with  . Rash     (Consider location/radiation/quality/duration/timing/severity/associated sxs/prior Treatment) Patient is a 24 y.o. male presenting with rash. The history is provided by the patient.  Rash Location: right foot and groin. Quality: dryness and itchiness   Quality: not draining and not painful   Severity:  Moderate Onset quality:  Gradual Duration:  2 weeks Timing:  Intermittent Progression:  Worsening Chronicity:  Recurrent Context: not food, not medications and not new detergent/soap   Relieved by:  Nothing Ineffective treatments:  None tried Associated symptoms: joint pain   Associated symptoms: no shortness of breath, no tongue swelling and not wheezing     History reviewed. No pertinent past medical history. Past Surgical History  Procedure Laterality Date  . Knee surgery    . Mouth surgery     No family history on file. History  Substance Use Topics  . Smoking status: Never Smoker   . Smokeless tobacco: Not on file  . Alcohol Use: No    Review of Systems  Respiratory: Negative for shortness of breath and wheezing.   Musculoskeletal: Positive for arthralgias.  Skin: Positive for rash.  All other systems reviewed and are negative.     Allergies  Review of patient's allergies indicates no known allergies.  Home Medications   Prior to Admission medications   Medication Sig Start Date End Date Taking? Authorizing Provider  benzonatate (TESSALON) 100 MG capsule Take 1 capsule (100 mg total) by mouth every 8 (eight) hours. Patient not taking: Reported on 03/25/2015 09/30/14   Eber Hong, MD  clotrimazole (LOTRIMIN) 1 % cream Apply to affected area 2 times daily Patient not taking: Reported on 03/25/2015 08/09/14   Tammy Triplett, PA-C  naproxen (NAPROSYN) 500 MG tablet Take 1 tablet (500 mg  total) by mouth 2 (two) times daily. Take with food Patient not taking: Reported on 03/25/2015 08/09/14   Tammy Triplett, PA-C   BP 121/78 mmHg  Pulse 62  Temp(Src) 98.2 F (36.8 C) (Oral)  Resp 18  Ht  (1.778 m)  Wt 220 lb (99.791 kg)  BMI 31.57 kg/m2  SpO2 98% Physical Exam  Constitutional: He is oriented to person, place, and time. He appears well-developed and well-nourished.  Non-toxic appearance.  HENT:  Head: Normocephalic.  Right Ear: Tympanic membrane and external ear normal.  Left Ear: Tympanic membrane and external ear normal.  Eyes: EOM and lids are normal. Pupils are equal, round, and reactive to light.  Neck: Normal range of motion. Neck supple. Carotid bruit is not present.  Cardiovascular: Normal rate, regular rhythm, normal heart sounds, intact distal pulses and normal pulses.   Pulmonary/Chest: Breath sounds normal. No respiratory distress.  Abdominal: Soft. Bowel sounds are normal. There is no tenderness. There is no guarding.  Genitourinary:  Dry scaling rash between the legs and on the inner aspect of the thighs. No drainage or red streaks appreciated.  Musculoskeletal: Normal range of motion.  Dry flaking rash noted between the toes. There are a few areas with macerated tissue between the toes. There is no drainage appreciated. Is no swelling of the web spaces. There is no swelling of the toes. There no red streaks going up the foot.  Lymphadenopathy:       Head (right side): No submandibular adenopathy present.  Head (left side): No submandibular adenopathy present.    He has no cervical adenopathy.  Neurological: He is alert and oriented to person, place, and time. He has normal strength. No cranial nerve deficit or sensory deficit.  Skin: Skin is warm and dry. Rash noted.  Psychiatric: He has a normal mood and affect. His speech is normal.  Nursing note and vitals reviewed.   ED Course  Procedures (including critical care time) Labs  Review Labs Reviewed - No data to display  Imaging Review No results found.   EKG Interpretation None      MDM  Vital signs are well within normal limits. Pulse oximetry is 98% on room air. Within normal limits by my interpretation. The examination is consistent with athlete's foot and jock itch. There is no evidence of any major secondary infection. The patient will be treated with Tinactin cream and powder. The patient is to follow with his primary physician or return to the emergency department if not improving.    Final diagnoses:  Athlete's foot on right  Jock itch    *I have reviewed nursing notes, vital signs, and all appropriate lab and imaging results for this patient.[address]**    Jasdeep Kepner, PA-C 03/26/15 2226  Raeford RazorStephen Kohut, MD 03/29/15 [phone]

## 2016-01-23 IMAGING — CR DG FOOT COMPLETE 3+V*R*
3 series · 3 of 3 positions shown · non-contrast
Comparison: None.

CLINICAL DATA: Right foot pain. Fourth and fifth metatarsal pain.
No prior surgery or trauma reported.

EXAM:
RIGHT FOOT COMPLETE - 3+ VIEW

[view not recorded (1 of 3)]
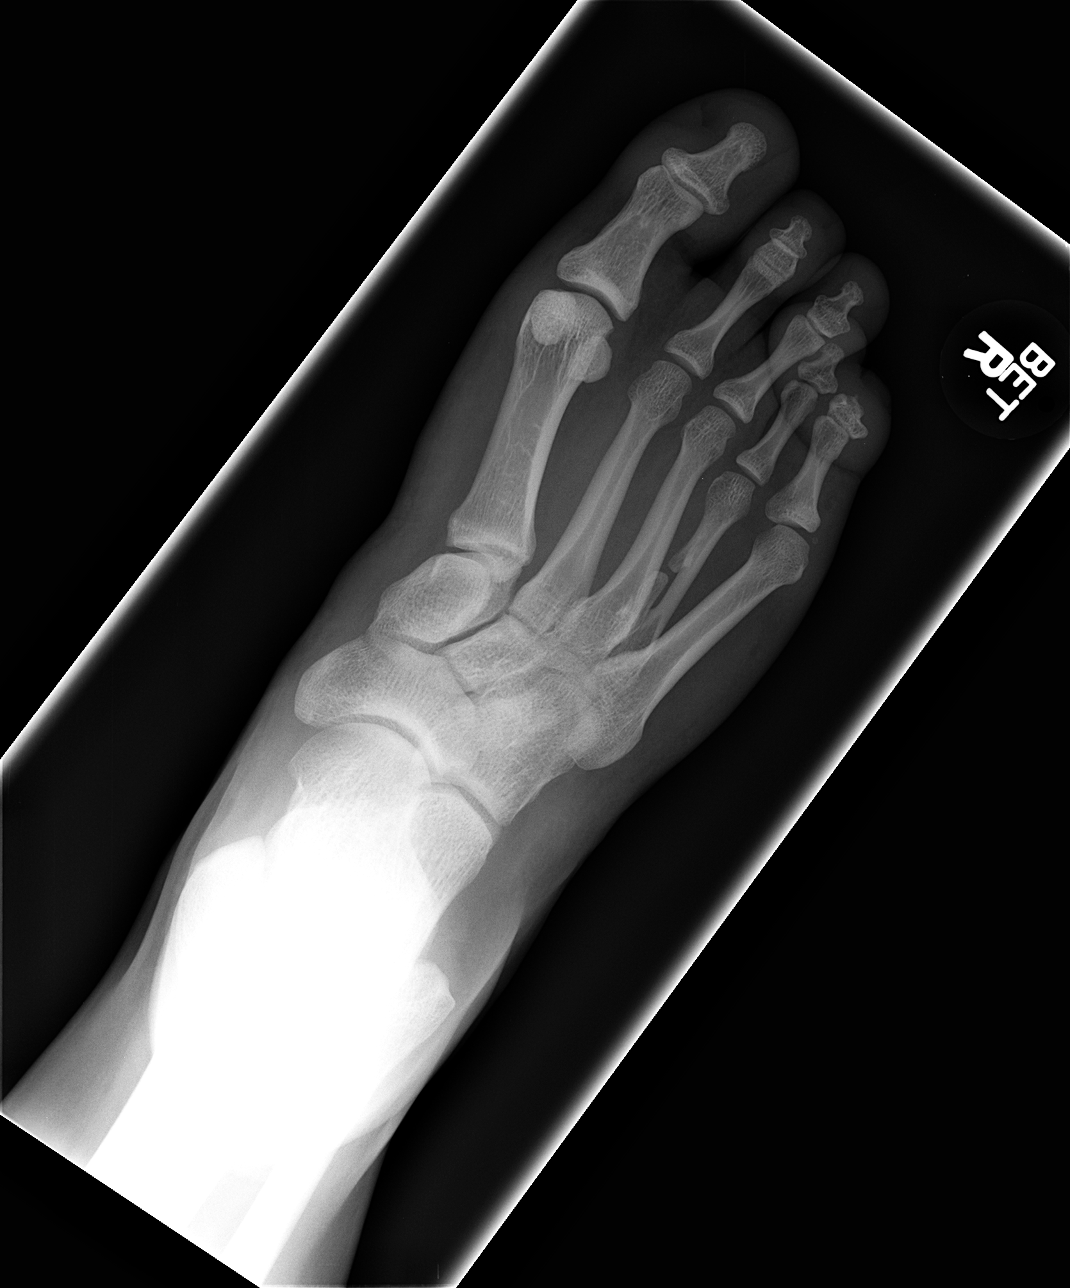

[view not recorded (2 of 3)]
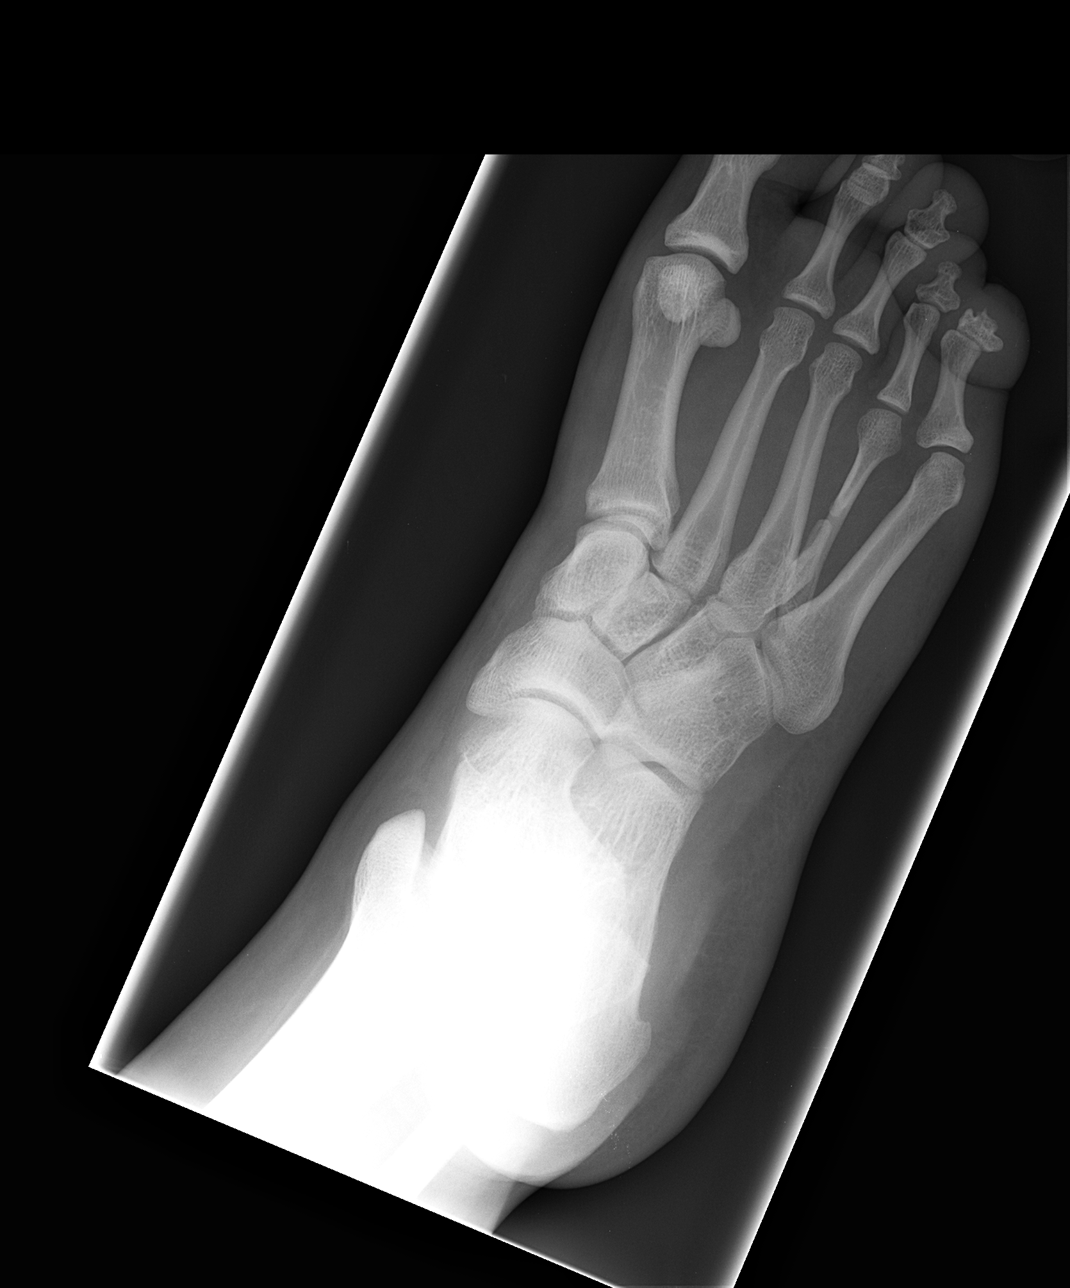

[view not recorded (3 of 3)]
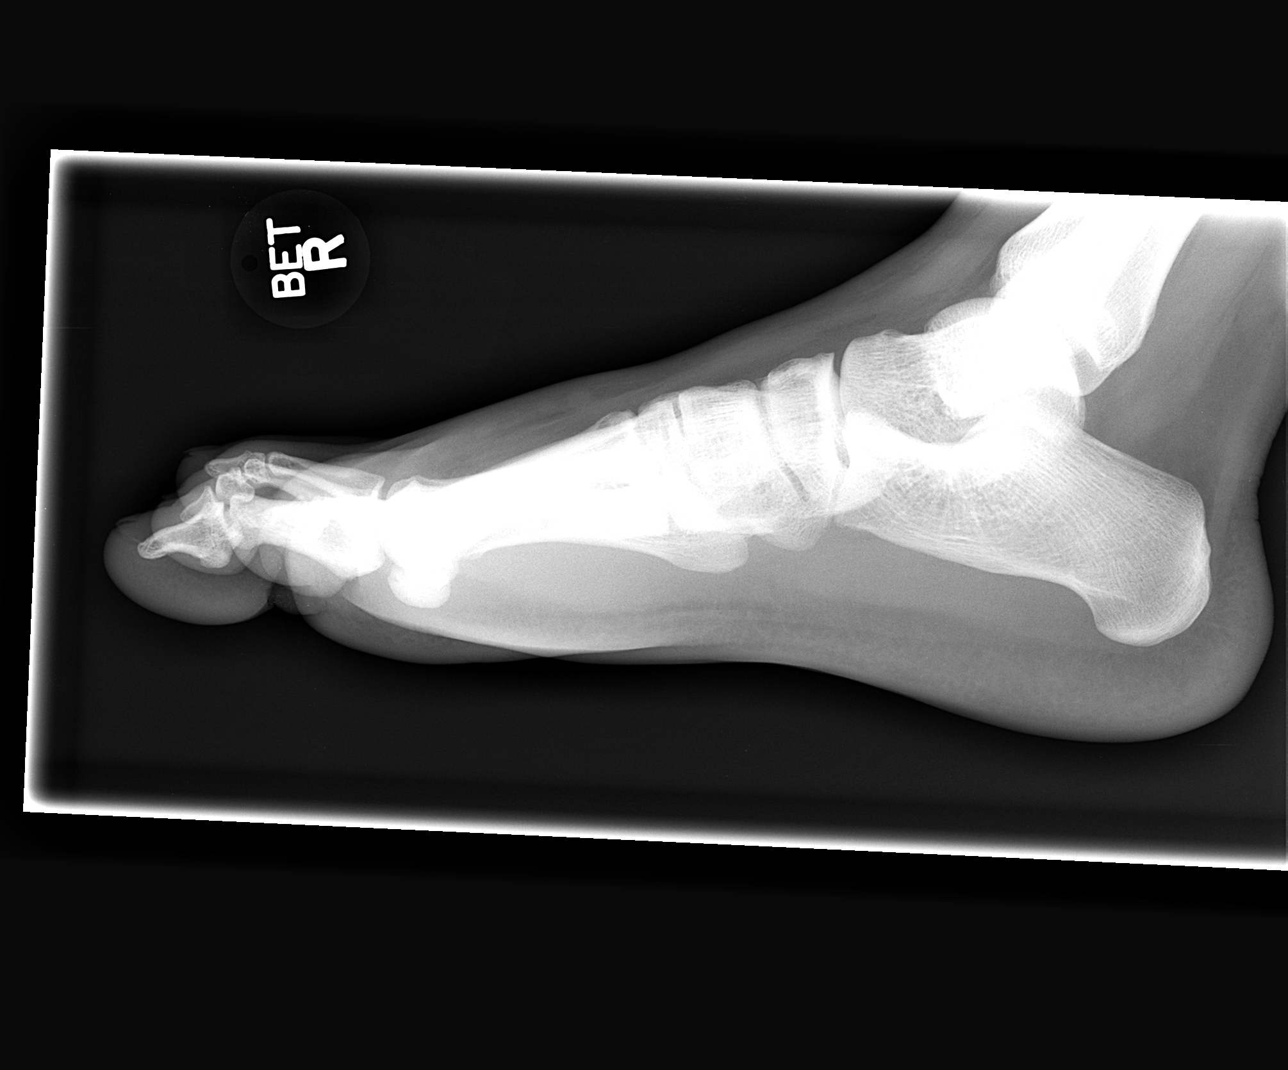

[3 of 3 positions shown; findings below may reference images not displayed]

FINDINGS: There is deformity of the midshaft fourth metatarsal with nonunion
of the superior aspect of the cortex and sclerotic thickening of the
inferior cortex with no medullary space. No evidence of acute
fracture dislocation in the midfoot or forefoot. The phalanges are
normal. Calcaneus is normal.
IMPRESSION: 1. No acute findings of the right foot.
2. Congenital versus remote posttraumatic deformity of the midshaft
fourth metatarsal. This may be source of patient's pain. Recommend
nonemergent orthopedic consultation.

## 2016-03-09 ENCOUNTER — Encounter (HOSPITAL_COMMUNITY): Payer: Self-pay | Admitting: Emergency Medicine

## 2016-03-09 ENCOUNTER — Emergency Department (HOSPITAL_COMMUNITY)
Admission: EM | Admit: 2016-03-09 | Discharge: 2016-03-09 | Disposition: A | Discharge status: Home / Self Care | Payer: BLUE CROSS/BLUE SHIELD | Attending: Emergency Medicine | Admitting: Emergency Medicine

## 2016-03-09 DIAGNOSIS — R197 Diarrhea, unspecified: Secondary | ICD-10-CM | POA: Diagnosis present

## 2016-03-09 DIAGNOSIS — R3129 Other microscopic hematuria: Secondary | ICD-10-CM | POA: Insufficient documentation

## 2016-03-09 DIAGNOSIS — K529 Noninfective gastroenteritis and colitis, unspecified: Secondary | ICD-10-CM | POA: Diagnosis not present

## 2016-03-09 LAB — CBC
HEMATOCRIT: 46.3 % (ref 39.0–52.0)
HEMOGLOBIN: 15.4 g/dL (ref 13.0–17.0)
MCH: 29.3 pg (ref 26.0–34.0)
MCHC: 33.3 g/dL (ref 30.0–36.0)
MCV: 88.2 fL (ref 78.0–100.0)
PLATELETS: 226 10*3/uL (ref 150–400)
RBC: 5.25 MIL/uL (ref 4.22–5.81)
RDW: 12 % (ref 11.5–15.5)
WBC: 6.6 10*3/uL (ref 4.0–10.5)

## 2016-03-09 LAB — URINALYSIS, ROUTINE W REFLEX MICROSCOPIC
Bilirubin Urine: NEGATIVE
GLUCOSE, UA: NEGATIVE mg/dL
Ketones, ur: NEGATIVE mg/dL
LEUKOCYTES UA: NEGATIVE
Nitrite: NEGATIVE
PROTEIN: NEGATIVE mg/dL
SPECIFIC GRAVITY, URINE: 1.025 (ref 1.005–1.030)
pH: 5.5 (ref 5.0–8.0)

## 2016-03-09 LAB — COMPREHENSIVE METABOLIC PANEL
ALT: 26 U/L (ref 17–63)
ANION GAP: 7 (ref 5–15)
AST: 23 U/L (ref 15–41)
Albumin: 4.7 g/dL (ref 3.5–5.0)
Alkaline Phosphatase: 49 U/L (ref 38–126)
BUN: 18 mg/dL (ref 6–20)
CHLORIDE: 103 mmol/L (ref 101–111)
CO2: 25 mmol/L (ref 22–32)
CREATININE: 0.85 mg/dL (ref 0.61–1.24)
Calcium: 9.2 mg/dL (ref 8.9–10.3)
Glucose, Bld: 105 mg/dL — ABNORMAL HIGH (ref 65–99)
POTASSIUM: 3.9 mmol/L (ref 3.5–5.1)
SODIUM: 135 mmol/L (ref 135–145)
Total Bilirubin: 1.3 mg/dL — ABNORMAL HIGH (ref 0.3–1.2)
Total Protein: 7.9 g/dL (ref 6.5–8.1)

## 2016-03-09 LAB — LIPASE, BLOOD: LIPASE: 13 U/L (ref 11–51)

## 2016-03-09 LAB — URINE MICROSCOPIC-ADD ON

## 2016-03-09 LAB — CK: CK TOTAL: 195 U/L (ref 49–397)

## 2016-03-09 MED ORDER — ONDANSETRON HCL 4 MG/2ML IJ SOLN
4.0000 mg | Freq: Once | INTRAMUSCULAR | Status: AC
  Administered 2016-03-09: 4 mg via INTRAVENOUS
  Filled 2016-03-09: qty 2

## 2016-03-09 MED ORDER — ONDANSETRON 4 MG PO TBDP: 4.0000 mg | ORAL_TABLET | Freq: Three times a day (TID) | ORAL | Status: DC | PRN

## 2016-03-09 MED ORDER — GI COCKTAIL ~~LOC~~
30.0000 mL | Freq: Once | ORAL | Status: AC
  Administered 2016-03-09: 30 mL via ORAL
  Filled 2016-03-09: qty 30

## 2016-03-09 MED ORDER — SODIUM CHLORIDE 0.9 % IV BOLUS (SEPSIS)
1000.0000 mL | Freq: Once | INTRAVENOUS | Status: AC
  Administered 2016-03-09: 1000 mL via INTRAVENOUS

## 2016-03-09 NOTE — ED Provider Notes (Signed)
CSN: [id number]     Arrival date & time 03/09/16  1701 History   First MD Initiated Contact with Patient 03/09/16 1825     Chief Complaint  Patient presents with  . Emesis     (Consider location/radiation/quality/duration/timing/severity/associated sxs/prior Treatment) HPI  25 year old male presents with vomiting and diarrhea. Started this morning around 11 AM. Started 10 minutes after eating [employer] chicken. Has had epigastric abd pain that feels like someone is punching him. Rates it as an 8/10. Patient does not currently feel nauseated but has not been able to keep any fluids down whenever he tries. 3 episodes of vomiting and 3 episodes of diarrhea. No blood in either. Denies any fevers. No recent antibiotics. Denies urinary symptoms including no dysuria or hematuria. No back pain.  History reviewed. No pertinent past medical history. Past Surgical History  Procedure Laterality Date  . Knee surgery    . Mouth surgery     History reviewed. No pertinent family history. Social History  Substance Use Topics  . Smoking status: Never Smoker   . Smokeless tobacco: None  . Alcohol Use: Yes     Comment: weekly    Review of Systems  Constitutional: Negative for fever.  Gastrointestinal: Positive for nausea, vomiting, abdominal pain and diarrhea.  Genitourinary: Negative for dysuria and hematuria.  Musculoskeletal: Negative for back pain.  All other systems reviewed and are negative.     Allergies  Review of patient's allergies indicates no known allergies.  Home Medications   Prior to Admission medications   Medication Sig Start Date End Date Taking? Authorizing Provider  benzonatate (TESSALON) 100 MG capsule Take 1 capsule (100 mg total) by mouth every 8 (eight) hours. Patient not taking: Reported on 03/25/2015 09/30/14   Eber Hong, MD  clotrimazole (LOTRIMIN) 1 % cream Apply to affected area 2 times daily Patient not taking: Reported on 03/25/2015 08/09/14   Tammy  Triplett, PA-C  naproxen (NAPROSYN) 500 MG tablet Take 1 tablet (500 mg total) by mouth 2 (two) times daily. Take with food Patient not taking: Reported on 03/25/2015 08/09/14   Tammy Triplett, PA-C  tolnaftate (TINACTIN) 1 % cream Apply 1 application topically 2 (two) times daily. 03/25/15   Ivery Quale, PA-C  tolnaftate (TINACTIN) 1 % powder Apply 1 application topically 2 (two) times daily. 03/25/15   Ivery Quale, PA-C   BP 114/70 mmHg  Pulse 102  Temp(Src) 98.3 F (36.8 C) (Oral)  Resp 16  Ht  (1.803 m)  Wt 230 lb (104.327 kg)  BMI 32.09 kg/m2  SpO2 98% Physical Exam  Constitutional: He is oriented to person, place, and time. He appears well-developed and well-nourished.  HENT:  Head: Normocephalic and atraumatic.  Right Ear: External ear normal.  Left Ear: External ear normal.  Nose: Nose normal.  Mouth/Throat: No oropharyngeal exudate.  Eyes: Right eye exhibits no discharge. Left eye exhibits no discharge.  Neck: Neck supple.  Cardiovascular: Normal rate, regular rhythm, normal heart sounds and intact distal pulses.   Pulmonary/Chest: Effort normal and breath sounds normal.  Abdominal: Soft. There is tenderness in the epigastric area.  Musculoskeletal: He exhibits no edema.  Neurological: He is alert and oriented to person, place, and time.  Skin: Skin is warm and dry.  Nursing note and vitals reviewed.   ED Course  Procedures (including critical care time) Labs Review Labs Reviewed  COMPREHENSIVE METABOLIC PANEL - Abnormal; Notable for the following:    Glucose, Bld 105 (*)    Total Bilirubin  1.3 (*)    All other components within normal limits  URINALYSIS, ROUTINE W REFLEX MICROSCOPIC (NOT AT [facility]) - Abnormal; Notable for the following:    Hgb urine dipstick MODERATE (*)    All other components within normal limits  URINE MICROSCOPIC-ADD ON - Abnormal; Notable for the following:    Squamous Epithelial / LPF 0-5 (*)    Bacteria, UA RARE (*)    All other  components within normal limits  LIPASE, BLOOD  CBC  CK    Imaging Review No results found. I have personally reviewed and evaluated these images and lab results as part of my medical decision-making.   EKG Interpretation None      MDM   Final diagnoses:  Gastroenteritis  Hematuria, microscopic    Patient feels much better after IV fluids, zofran, and GI cocktail. Likely with viral gastroenteritis. No alarming findings or symptoms. Pain now gone. Treat supportively and drink plenty of fluids. Do not think imaging needed given benign exam. Discussed return precautions.     Pricilla LovelessScott Patty Leitzke, MD 03/10/16 [phone]

## 2016-03-09 NOTE — ED Notes (Signed)
Pt states he has been having generalized abd pain with vomiting and diarrhea all day today.  States he cannot keep any fluids down.

## 2016-08-24 ENCOUNTER — Encounter (HOSPITAL_COMMUNITY): Payer: Self-pay

## 2016-08-24 ENCOUNTER — Emergency Department (HOSPITAL_COMMUNITY)
Admission: EM | Admit: 2016-08-24 | Discharge: 2016-08-25 | Disposition: A | Discharge status: Home / Self Care | Payer: BLUE CROSS/BLUE SHIELD | Attending: Emergency Medicine | Admitting: Emergency Medicine

## 2016-08-24 DIAGNOSIS — L600 Ingrowing nail: Secondary | ICD-10-CM | POA: Insufficient documentation

## 2016-08-24 DIAGNOSIS — M79674 Pain in right toe(s): Secondary | ICD-10-CM | POA: Diagnosis present

## 2016-08-24 DIAGNOSIS — L02611 Cutaneous abscess of right foot: Secondary | ICD-10-CM | POA: Insufficient documentation

## 2016-08-24 DIAGNOSIS — L03031 Cellulitis of right toe: Secondary | ICD-10-CM

## 2016-08-24 MED ORDER — BUPIVACAINE HCL 0.25 % IJ SOLN: 20.0000 mL | Freq: Once | INTRAMUSCULAR | Status: DC

## 2016-08-24 MED ORDER — BUPIVACAINE HCL 0.25 % IJ SOLN
10.0000 mL | Freq: Once | INTRAMUSCULAR | Status: AC
  Administered 2016-08-25: 10 mL
  Filled 2016-08-24: qty 10

## 2016-08-24 MED ORDER — BUPIVACAINE HCL (PF) 0.25 % IJ SOLN
INTRAMUSCULAR | Status: AC
  Filled 2016-08-24: qty 30

## 2016-08-24 NOTE — ED Triage Notes (Signed)
Pt was at work tonight when he felt a burning pressure to his right great toe.  Pt took his boot off and noticed his great toe was swollen and red.

## 2016-08-25 MED ORDER — TRAMADOL HCL 50 MG PO TABS: 50.0000 mg | ORAL_TABLET | Freq: Four times a day (QID) | ORAL | 0 refills | Status: DC | PRN

## 2016-08-25 NOTE — Discharge Instructions (Signed)
INGROWN TOENAIL  Keep area clean, dry and bandaged for 24 hours.  After 24 hours, remove outer bandage and leave yellow gauze in place.  Soak toe/foot in warm soapy water for 5-10 minutes, once daily for 5 days. Rebandage toe after each cleaning.  Continue soaks until yellow gauze falls off.  Seek immediate medical care if you experience any of the following signs of infection: Swelling, redness, pus drainage, streaking, fever > 101.0 F

## 2016-08-28 NOTE — ED Provider Notes (Signed)
[facility]-EMERGENCY DEPT Provider Note   CSN: [id number] Arrival date & time: 08/24/16  2258     History   Chief Complaint Chief Complaint  Patient presents with  . Toe Pain    HPI Juan Beasley is a 25 y.o. male who presents for R toe pain . The patient states that he had pain in his toe at work and noticed swelling and redness when he took off his boot. He had no previous issue with the toe. He denies injury, fever, chills,   HPI  No past medical history on file.  Patient Active Problem List   Diagnosis Date Noted  . UNSPEC LOCAL INFECTION SKIN&SUBCUTANEOUS TISSUE 09/13/2008    Past Surgical History:  Procedure Laterality Date  . KNEE SURGERY    . MOUTH SURGERY         Home Medications    Prior to Admission medications   Medication Sig Start Date End Date Taking? Authorizing Provider  ondansetron (ZOFRAN ODT) 4 MG disintegrating tablet Take 1 tablet (4 mg total) by mouth every 8 (eight) hours as needed for nausea or vomiting. 03/09/16   Pricilla LovelessScott Goldston, MD  traMADol (ULTRAM) 50 MG tablet Take 1 tablet (50 mg total) by mouth every 6 (six) hours as needed. 08/25/16   Arthor CaptainAbigail Nocole Zammit, PA-C    Family History No family history on file.  Social History Social History  Substance Use Topics  . Smoking status: Never Smoker  . Smokeless tobacco: Never Used  . Alcohol use Yes     Comment: weekly     Allergies   Review of patient's allergies indicates no known allergies.   Review of Systems Review of Systems  Ten systems reviewed and are negative for acute change, except as noted in the HPI.   Physical Exam Updated Vital Signs BP 126/80 (BP Location: Left Arm)   Pulse 86   Temp 98.4 F (36.9 C) (Oral)   Resp 16   Ht 5\' 8"  (1.727 m)   Wt 99.8 kg   SpO2 98%   BMI 33.45 kg/m   Physical Exam  Constitutional: He appears well-nourished. No distress.  HENT:  Head: Atraumatic.  Well healed cleft lip repair  Eyes: Conjunctivae are normal. No scleral  icterus.  Neck: Normal range of motion. Neck supple.  Cardiovascular: Normal rate, regular rhythm and normal heart sounds.   Pulses:      Dorsalis pedis pulses are 2+ on the right side, and 2+ on the left side.       Posterior tibial pulses are 2+ on the right side, and 2+ on the left side.  Pulmonary/Chest: Effort normal and breath sounds normal. No respiratory distress.  Abdominal: Soft. There is no tenderness.  Musculoskeletal: He exhibits no edema.       Right foot: There is deformity.       Left foot: There is deformity.       Feet:  Neurological: He is alert.  Skin: Skin is warm and dry. He is not diaphoretic.  Psychiatric: His behavior is normal.  Nursing note and vitals reviewed.    ED Treatments / Results  Labs (all labs ordered are listed, but only abnormal results are displayed) Labs Reviewed - No data to display  EKG  EKG Interpretation None       Radiology No results found.  Procedures Excise ingrown toenail Date/Time: 08/28/2016 4:25 PM Performed by: Arthor CaptainHARRIS, Jonta Gastineau Authorized by: Arthor CaptainHARRIS, Haron Beilke  Consent: Verbal consent obtained. Consent given by: patient Patient identity confirmed:  verbally with patient Local anesthesia used: yes Anesthesia: digital block  Anesthesia: Local anesthesia used: yes Local Anesthetic: bupivacaine 0.25% without epinephrine Anesthetic total: 6 mL Patient tolerance: Patient tolerated the procedure well with no immediate complications  .Marland Kitchen.Incision and Drainage Date/Time: 08/28/2016 4:26 PM Performed by: Arthor CaptainHARRIS, Twylia Oka Authorized by: Arthor CaptainHARRIS, Onisha Cedeno   Consent:    Consent obtained:  Verbal Location:    Indications for incision and drainage: paronychia    Location:  Lower extremity   Lower extremity location:  Toe   Toe location:  R big toe Pre-procedure details:    Skin preparation:  Betadine Anesthesia (see MAR for exact dosages):    Anesthesia method: see toenai excision. Procedure type:    Complexity:   Simple Procedure details:    Incision types:  Stab incision   Incision depth:  Dermal   Scalpel blade:  11   Wound management:  Probed and deloculated   Drainage:  Purulent   Drainage amount:  Moderate   Wound treatment:  Wound left open Post-procedure details:    Patient tolerance of procedure:  Tolerated well, no immediate complications   (including critical care time)  Medications Ordered in ED Medications  bupivacaine (MARCAINE) 0.25 % (with pres) injection 10 mL (10 mLs Infiltration Given 08/25/16 0113)     Initial Impression / Assessment and Plan / ED Course  I have reviewed the triage vital signs and the nursing notes.  Pertinent labs & imaging results that were available during my care of the patient were reviewed by me and considered in my medical decision making (see chart for details).  Clinical Course    Patient with infected ingrown toenail. Xeroform gauze placed at sight nail removal. NO abx necessary. Discussed homecare and patient to return in 2 days for wound recheck. The patient appears reasonably screened and/or stabilized for discharge and I doubt any other medical condition or other [facility] requiring further screening, evaluation, or treatment in the ED at this time prior to discharge.   Final Clinical Impressions(s) / ED Diagnoses   Final diagnoses:  Abscess of toenail on right foot  Ingrown right greater toenail    New Prescriptions Discharge Medication List as of 08/25/2016 12:41 AM    START taking these medications   Details  traMADol (ULTRAM) 50 MG tablet Take 1 tablet (50 mg total) by mouth every 6 (six) hours as needed., Starting Sat 08/25/2016, Print         [place] LandingAbigail Nalah Macioce, PA-C 08/28/16 1633    Kristen N Ward, DO 08/28/16 2258

## 2019-05-26 ENCOUNTER — Other Ambulatory Visit: Payer: BLUE CROSS/BLUE SHIELD

## 2019-05-26 ENCOUNTER — Other Ambulatory Visit: Payer: Self-pay

## 2019-05-26 DIAGNOSIS — Z20822 Contact with and (suspected) exposure to covid-19: Secondary | ICD-10-CM

## 2019-05-28 LAB — NOVEL CORONAVIRUS, NAA: SARS-CoV-2, NAA: NOT DETECTED

## 2021-07-12 ENCOUNTER — Encounter: Payer: Self-pay | Admitting: Emergency Medicine

## 2021-07-12 ENCOUNTER — Ambulatory Visit
Admission: EM | Admit: 2021-07-12 | Discharge: 2021-07-12 | Disposition: A | Discharge status: Home / Self Care | Payer: BC Managed Care – PPO | Attending: Family Medicine | Admitting: Family Medicine

## 2021-07-12 DIAGNOSIS — L720 Epidermal cyst: Secondary | ICD-10-CM

## 2021-07-12 MED ORDER — DOXYCYCLINE HYCLATE 100 MG PO CAPS: 100.0000 mg | ORAL_CAPSULE | Freq: Two times a day (BID) | ORAL | 0 refills | Status: AC

## 2021-07-12 NOTE — ED Provider Notes (Signed)
  [facility]   [id number] 07/12/21 Arrival Time: 1857  ASSESSMENT & PLAN:  1. Epidermoid cyst of skin of scalp     Incision and Drainage Procedure Note  Anesthesia: PainEase spray  Procedure Details  The procedure, risks and complications have been discussed in detail (including, but not limited to pain and bleeding) with the patient.  The skin induration was prepped and draped in the usual fashion. After adequate local anesthesia, I&D with a #11 blade was performed on the right superior scalp with purulent, cystic  drainage. Bleeding controlled with pressure.  EBL: minimal Drains: none Packing: none Condition: Tolerated procedure well Complications: none.  Begin: Meds ordered this encounter  Medications   doxycycline (VIBRAMYCIN) 100 MG capsule    Sig: Take 1 capsule (100 mg total) by mouth 2 (two) times daily.    Dispense:  14 capsule    Refill:  0   Wound care instructions discussed and given in written format. To return in 48 hours for wound check.  Finish all antibiotics. OTC analgesics as needed.  Reviewed expectations re: course of current medical issues. Questions answered. Outlined signs and symptoms indicating need for more acute intervention. Patient verbalized understanding. After Visit Summary given.   SUBJECTIVE:  Juan Beasley is a 30 y.o. male who presents with a possible infection of his scalp. Onset gradual, approximately  2-3  days ago without active drainage and without active bleeding. Symptoms have gradually worsened since beginning. Fever: absent. OTC/home treatment: none.   OBJECTIVE:  Vitals:   07/12/21 1903  BP: (!) 148/82  Pulse: 88  Resp: 18  Temp: 98.1 F (36.7 C)  TempSrc: Temporal  SpO2: 95%     General appearance: alert; no distress Scalp: approx 1.5 cm induration of his superior R scalp; tender to touch; no active drainage or bleeding Psychological: alert and cooperative; normal mood and affect  No Known  Allergies  History reviewed. No pertinent past medical history. Social History   Socioeconomic History   Marital status: Single    Spouse name: Not on file   Number of children: Not on file   Years of education: Not on file   Highest education level: Not on file  Occupational History   Not on file  Tobacco Use   Smoking status: Never   Smokeless tobacco: Never  Substance and Sexual Activity   Alcohol use: Yes    Comment: weekly   Drug use: No   Sexual activity: Not on file  Other Topics Concern   Not on file  Social History Narrative   Not on file   Social Determinants of Health   Financial Resource Strain: Not on file  Food Insecurity: Not on file  Transportation Needs: Not on file  Physical Activity: Not on file  Stress: Not on file  Social Connections: Not on file   Family History  Problem Relation Age of Onset   Diabetes Mother    Healthy Father    Past Surgical History:  Procedure Laterality Date   KNEE SURGERY     MOUTH SURGERY              Alanreed, Arlys John, MD 07/13/21 [phone]

## 2021-07-12 NOTE — ED Triage Notes (Signed)
Bump on top of head x 2 days.  States area is sore to the touch

## 2023-05-20 ENCOUNTER — Ambulatory Visit: Payer: BC Managed Care – PPO
